# Patient Record
Sex: Male | Born: 1995 | Race: Black or African American | Hispanic: No | Marital: Single | Smoking: Never smoker
Health system: Southern US, Community
[De-identification: ages and names within clinical notes are randomized; demographics above are authoritative.]

## PROBLEM LIST (undated history)

## (undated) DIAGNOSIS — J45909 Unspecified asthma, uncomplicated: Secondary | ICD-10-CM

---
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
## 2018-12-20 ENCOUNTER — Encounter: Payer: Self-pay | Admitting: Emergency Medicine

## 2018-12-20 ENCOUNTER — Other Ambulatory Visit: Payer: Self-pay

## 2018-12-20 ENCOUNTER — Emergency Department
Admission: EM | Admit: 2018-12-20 | Discharge: 2018-12-20 | Disposition: A | Discharge status: Home / Self Care | Payer: Medicaid Other | Attending: Emergency Medicine | Admitting: Emergency Medicine

## 2018-12-20 ENCOUNTER — Emergency Department: Payer: Medicaid Other

## 2018-12-20 DIAGNOSIS — R0789 Other chest pain: Secondary | ICD-10-CM | POA: Diagnosis present

## 2018-12-20 DIAGNOSIS — J45909 Unspecified asthma, uncomplicated: Secondary | ICD-10-CM | POA: Insufficient documentation

## 2018-12-20 DIAGNOSIS — I300 Acute nonspecific idiopathic pericarditis: Secondary | ICD-10-CM | POA: Insufficient documentation

## 2018-12-20 LAB — CBC
HCT: 48.2 % (ref 39.0–52.0)
Hemoglobin: 16.1 g/dL (ref 13.0–17.0)
MCH: 29.8 pg (ref 26.0–34.0)
MCHC: 33.4 g/dL (ref 30.0–36.0)
MCV: 89.1 fL (ref 80.0–100.0)
Platelets: 303 10*3/uL (ref 150–400)
RBC: 5.41 MIL/uL (ref 4.22–5.81)
RDW: 12.2 % (ref 11.5–15.5)
WBC: 4.7 10*3/uL (ref 4.0–10.5)
nRBC: 0 % (ref 0.0–0.2)

## 2018-12-20 LAB — BASIC METABOLIC PANEL
Anion gap: 10 (ref 5–15)
BUN: 18 mg/dL (ref 6–20)
CO2: 27 mmol/L (ref 22–32)
Calcium: 9.7 mg/dL (ref 8.9–10.3)
Chloride: 103 mmol/L (ref 98–111)
Creatinine, Ser: 1.07 mg/dL (ref 0.61–1.24)
GFR calc Af Amer: >60 mL/min (ref >60)
GFR calc non Af Amer: >60 mL/min (ref >60)
Glucose, Bld: 97 mg/dL (ref 70–99)
Potassium: 4.1 mmol/L (ref 3.5–5.1)
Sodium: 140 mmol/L (ref 135–145)

## 2018-12-20 LAB — TROPONIN I (HIGH SENSITIVITY)
Troponin I (High Sensitivity): 6 ng/L (ref <18)
Troponin I (High Sensitivity): 5 ng/L (ref <18)

## 2018-12-20 MED ORDER — KETOROLAC TROMETHAMINE 60 MG/2ML IM SOLN: 30.0000 mg | Freq: Once | INTRAMUSCULAR | Status: DC

## 2018-12-20 MED ORDER — NAPROXEN 500 MG PO TABS: 500.0000 mg | ORAL_TABLET | Freq: Once | ORAL | Status: DC

## 2018-12-20 MED ORDER — NAPROXEN 500 MG PO TABS
500.0000 mg | ORAL_TABLET | Freq: Two times a day (BID) | ORAL | 0 refills | Status: AC
Start: 2018-12-20 — End: 2018-12-30

## 2018-12-20 MED ORDER — KETOROLAC TROMETHAMINE 30 MG/ML IJ SOLN
15.0000 mg | Freq: Once | INTRAMUSCULAR | Status: AC
  Administered 2018-12-20: 19:00:00 15 mg via INTRAVENOUS
  Filled 2018-12-20: qty 1

## 2018-12-20 NOTE — ED Triage Notes (Signed)
Pt here for central chest pain that started yesterday. Pain is constant. Denies any and all associated symptoms. No fever. No drug use. Pain worse when leaning forward. NSAIDS help mildly and very temporarily.

## 2018-12-20 NOTE — ED Notes (Signed)
Pt sitting in bed playing on phone in NAD, A&Ox4. Pt answers questions appropriately, no SOB reported, pain 4/10

## 2018-12-20 NOTE — ED Notes (Signed)
Per Dr. Charna Archer ok for patient to drink, pt given grape juice

## 2018-12-20 NOTE — ED Provider Notes (Signed)
[facility] Emergency Department Provider Note   ____________________________________________   First MD Initiated Contact with Patient 12/20/18 1540     (approximate)  I have reviewed the triage vital signs and the nursing notes.   HISTORY  Chief Complaint Chest Pain    HPI Erik Skinner is a 23 y.o. male with past medical history of asthma who presents to the ED complaining of chest pain.  Patient reports he has been having constant pain in the center of his chest since yesterday afternoon.  He describes the pain as sharp and feeling like pins-and-needles in the center of his chest.  It seems to be exacerbated with certain shifts in his position and when he puts his arms in a particular position.  He denies any trauma to his chest, has not had any fevers, cough, or shortness of breath.  He denies any pain or swelling in his lower extremities, has not had any recent surgeries or long trips.        Past Medical History:  Diagnosis Date  . Asthma     There are no active problems to display for this patient.   History reviewed. No pertinent surgical history.  Prior to Admission medications   Medication Sig Start Date End Date Taking? Authorizing Provider  albuterol (PROVENTIL HFA;VENTOLIN HFA) 108 (90 Base) MCG/ACT inhaler Inhale 2 puffs into the lungs every 6 (six) hours as needed for wheezing or shortness of breath. 06/13/16   Laban Emperor, PA-C  fluticasone (FLONASE) 50 MCG/ACT nasal spray Place 2 sprays into both nostrils daily. 06/13/16 06/13/17  Laban Emperor, PA-C  naproxen (NAPROSYN) 500 MG tablet Take 1 tablet (500 mg total) by mouth 2 (two) times daily with a meal for 10 days. 12/20/18 12/30/18  Blake Divine, MD    Allergies Patient has no known allergies.  History reviewed. No pertinent family history.  Social History Social History   Tobacco Use  . Smoking status: Never Smoker  . Smokeless tobacco: Never Used  Substance Use Topics   . Alcohol use: No  . Drug use: Not on file    Review of Systems  Constitutional: No fever/chills Eyes: No visual changes. ENT: No sore throat. Cardiovascular: Positive for chest pain. Respiratory: Denies shortness of breath. Gastrointestinal: No abdominal pain.  No nausea, no vomiting.  No diarrhea.  No constipation. Genitourinary: Negative for dysuria. Musculoskeletal: Negative for back pain. Skin: Negative for rash. Neurological: Negative for headaches, focal weakness or numbness.  ____________________________________________   PHYSICAL EXAM:  VITAL SIGNS: ED Triage Vitals  Enc Vitals Group     BP 12/20/18 1528 127/77     Pulse Rate 12/20/18 1528 65     Resp 12/20/18 1528 16     Temp 12/20/18 1528 98.8 F (37.1 C)     Temp Source 12/20/18 1528 Oral     SpO2 12/20/18 1528 99 %     Weight 12/20/18 1517 160 lb (72.6 kg)     Height 12/20/18 1517 5\' 10"  (1.778 m)     Head Circumference --      Peak Flow --      Pain Score 12/20/18 1517 4     Pain Loc --      Pain Edu? --      Excl. in [place]? --     Constitutional: Alert and oriented. Eyes: Conjunctivae are normal. Head: Atraumatic. Nose: No congestion/rhinnorhea. Mouth/Throat: Mucous membranes are moist. Neck: Normal ROM Cardiovascular: Normal rate, regular rhythm. Grossly normal heart sounds. Respiratory:  Normal respiratory effort.  No retractions. Lungs CTAB.  Mild tenderness to palpation of central chest. Gastrointestinal: Soft and nontender. No distention. Genitourinary: deferred Musculoskeletal: No lower extremity tenderness nor edema. Neurologic:  Normal speech and language. No gross focal neurologic deficits are appreciated. Skin:  Skin is warm, dry and intact. No rash noted. Psychiatric: Mood and affect are normal. Speech and behavior are normal.  ____________________________________________   LABS (all labs ordered are listed, but only abnormal results are displayed)  Labs Reviewed  BASIC METABOLIC  PANEL  CBC  TROPONIN I (HIGH SENSITIVITY)  TROPONIN I (HIGH SENSITIVITY)   ____________________________________________  EKG  ED ECG REPORT I, Chesley Noonharles Ayan Yankey, the attending physician, personally viewed and interpreted this ECG.   Date: 12/20/2018  EKG Time: 15:22  Rate: 72  Rhythm: normal sinus rhythm  Axis: Normal  Intervals:none  ST&T Change: Diffuse ST elevation with no reciprocal ST depression    PROCEDURES  Procedure(s) performed (including Critical Care):  Procedures   ____________________________________________   INITIAL IMPRESSION / ASSESSMENT AND PLAN / ED COURSE       23 year old male presents to the ED complaining of constant sharp and tingly pain in the center of his chest since yesterday which seems to be exacerbated by certain positions.  EKG shows diffuse ST elevation with no reciprocal ST depressions, most consistent with pericarditis.  Relatively low suspicion for ACS.  EKG discussed with Dr. Mariah MillingGollan by Dr. Cyril LoosenKinner shortly after patient's arrival, who agreed that pericarditis is most likely explanation unless troponin were to be elevated.  Labs reassuring, troponin within normal limits.  Would expect some elevation in troponin by now if this were to be ACS given patient has had constant symptoms since yesterday.  Chest x-ray negative for acute process.  We will plan on checking second set troponin, if this remains within normal limits patient would be appropriate for outpatient treatment with NSAIDs.  He did receive NSAIDs approximately 1 hour prior to arrival.  Case discussed with staff of Dr. Mariah MillingGollan, who will notify him of findings thus far and he will call back to the ED if needed.  Second set troponin within normal limits, will give dose of Toradol here in the ED and start patient on naproxen for apparent pericarditis.  Counseled patient on need to follow-up with PCP and return to the ED for new or worsening symptoms, patient agrees with plan.       ____________________________________________   FINAL CLINICAL IMPRESSION(S) / ED DIAGNOSES  Final diagnoses:  Acute idiopathic pericarditis  Atypical chest pain     ED Discharge Orders         Ordered    naproxen (NAPROSYN) 500 MG tablet  2 times daily with meals     12/20/18 1912           Note:  This document was prepared using Dragon voice recognition software and may include unintentional dictation errors.   Chesley NoonJessup, Lance Galas, MD 12/20/18 1921

## 2019-04-18 ENCOUNTER — Emergency Department: Payer: Medicaid Other

## 2019-04-18 ENCOUNTER — Other Ambulatory Visit: Payer: Self-pay

## 2019-04-18 ENCOUNTER — Encounter: Payer: Self-pay | Admitting: Emergency Medicine

## 2019-04-18 ENCOUNTER — Emergency Department
Admission: EM | Admit: 2019-04-18 | Discharge: 2019-04-18 | Disposition: A | Discharge status: Home / Self Care | Payer: Medicaid Other | Attending: Student | Admitting: Student

## 2019-04-18 DIAGNOSIS — J45909 Unspecified asthma, uncomplicated: Secondary | ICD-10-CM | POA: Insufficient documentation

## 2019-04-18 DIAGNOSIS — M25512 Pain in left shoulder: Secondary | ICD-10-CM

## 2019-04-18 DIAGNOSIS — M778 Other enthesopathies, not elsewhere classified: Secondary | ICD-10-CM | POA: Insufficient documentation

## 2019-04-18 MED ORDER — NAPROXEN 500 MG PO TABS: 500.0000 mg | ORAL_TABLET | Freq: Two times a day (BID) | ORAL | 1 refills | Status: AC

## 2019-04-18 MED ORDER — CYCLOBENZAPRINE HCL 5 MG PO TABS: 5.0000 mg | ORAL_TABLET | Freq: Every day | ORAL | 0 refills | Status: AC

## 2019-04-18 NOTE — ED Triage Notes (Signed)
Pt reports pain to left shoulder since early Tuesday. Pt reports no obvious injuries but cannot lift his left arm. Pt states started shortly ater work but states not WC.

## 2019-04-18 NOTE — Discharge Instructions (Signed)
Your exam and XR are negative for any acute fracture or rotator cuff tears. Take the prescription meds as directed. Continue to use the Salonpas, ice, and heat packs. Follow-up with Ortho for ongoing symptoms. Return as needed.

## 2019-04-18 NOTE — ED Provider Notes (Signed)
[facility] Emergency Department Provider Note ____________________________________________  Time seen: 1530  I have reviewed the triage vital signs and the nursing notes.  HISTORY  Chief Complaint  Shoulder Pain   HPI Erik Skinner is a 24 y.o. male presents to the ED for evaluation of left shoulder pain since Tuesday. He denies any injury, trauma, or weakness. He describes pain with ROM to the shoulder. He denies a history of chronic or ongoing shoulder pain.   Past Medical History:  Diagnosis Date  . Asthma     There are no problems to display for this patient.   History reviewed. No pertinent surgical history.  Prior to Admission medications   Medication Sig Start Date End Date Taking? Authorizing Provider  albuterol (PROVENTIL HFA;VENTOLIN HFA) 108 (90 Base) MCG/ACT inhaler Inhale 2 puffs into the lungs every 6 (six) hours as needed for wheezing or shortness of breath. 06/13/16   Enid Derry, PA-C  cyclobenzaprine (FLEXERIL) 5 MG tablet Take 1 tablet (5 mg total) by mouth at bedtime. 04/18/19   Esli Jernigan, Charlesetta Ivory, PA-C  fluticasone (FLONASE) 50 MCG/ACT nasal spray Place 2 sprays into both nostrils daily. 06/13/16 06/13/17  Enid Derry, PA-C  naproxen (NAPROSYN) 500 MG tablet Take 1 tablet (500 mg total) by mouth 2 (two) times daily with a meal. 04/18/19 05/18/19  Samarth Ogle, Charlesetta Ivory, PA-C    Allergies Patient has no known allergies.  History reviewed. No pertinent family history.  Social History Social History   Tobacco Use  . Smoking status: Never Smoker  . Smokeless tobacco: Never Used  Substance Use Topics  . Alcohol use: No  . Drug use: Not on file    Review of Systems  Constitutional: Negative for fever. Cardiovascular: Negative for chest pain. Respiratory: Negative for shortness of breath. Musculoskeletal: Negative for back pain. Left shoulder pain as above Skin: Negative for rash. Neurological: Negative for headaches,  focal weakness or numbness. ____________________________________________  PHYSICAL EXAM:  VITAL SIGNS: ED Triage Vitals  Enc Vitals Group     BP 04/18/19 1449 134/78     Pulse Rate 04/18/19 1449 61     Resp 04/18/19 1449 17     Temp 04/18/19 1449 98.2 F (36.8 C)     Temp Source 04/18/19 1449 Oral     SpO2 04/18/19 1449 97 %     Weight 04/18/19 1447 160 lb (72.6 kg)     Height 04/18/19 1447 5\' 11"  (1.803 m)     Head Circumference --      Peak Flow --      Pain Score 04/18/19 1447 5     Pain Loc --      Pain Edu? --      Excl. in [place]? --     Constitutional: Alert and oriented. Well appearing and in no distress. Head: Normocephalic and atraumatic. Eyes: Conjunctivae are normal.  Normal extraocular movements Neck: Supple. Normal ROM Cardiovascular: Normal rate, regular rhythm. Normal distal pulses. Respiratory: Normal respiratory effort. No wheezes/rales/rhonchi. Musculoskeletal: left shoulder without deformity, dislocation, or sulcus sign. Decreased extension and abduction to 90 degrees, due to pain. Normal rotator cuff testing. Normal grip strength bilaterally. Nontender with normal range of motion in all extremities.  Neurologic:  Normal gait without ataxia. Normal speech and language. No gross focal neurologic deficits are appreciated. Skin:  Skin is warm, dry and intact. No rash noted. ____________________________________________   RADIOLOGY  DG Left Shoulder Negative  ____________________________________________  PROCEDURES  Procedures ____________________________________________  INITIAL IMPRESSION /  ASSESSMENT AND PLAN / ED COURSE  Patient with ED evaluation of left shoulder pain.  His exam is overall benign reassuring at this time.  No signs of internal derangement.  X-rays negative for any acute fracture or dislocation present left represent a rotator cuff tendinitis at this time.  The preceding cause may be work-related activities.  He will be discharged with a  prescription for naproxen as well as Flexeril take as directed.  He will follow-up with Ortho for ongoing symptoms.  Return precautions have been reviewed.  Erik Skinner was evaluated in Emergency Department on 04/18/2019 for the symptoms described in the history of present illness. He was evaluated in the context of the global COVID-19 pandemic, which necessitated consideration that the patient might be at risk for infection with the SARS-CoV-2 virus that causes COVID-19. Institutional protocols and algorithms that pertain to the evaluation of patients at risk for COVID-19 are in a state of rapid change based on information released by regulatory bodies including the CDC and federal and state organizations. These policies and algorithms were followed during the patient's care in the ED. ____________________________________________  FINAL CLINICAL IMPRESSION(S) / ED DIAGNOSES  Final diagnoses:  Acute pain of left shoulder  Shoulder tendinitis, left      Rosland Riding, Dannielle Karvonen, PA-C 04/18/19 2148    Lilia Pro., MD 04/18/19 2342

## 2020-10-16 ENCOUNTER — Emergency Department
Admission: EM | Admit: 2020-10-16 | Discharge: 2020-10-16 | Disposition: A | Discharge status: Home / Self Care | Payer: Medicaid Other | Attending: Emergency Medicine | Admitting: Emergency Medicine

## 2020-10-16 ENCOUNTER — Encounter: Payer: Self-pay | Admitting: Emergency Medicine

## 2020-10-16 ENCOUNTER — Other Ambulatory Visit: Payer: Self-pay

## 2020-10-16 DIAGNOSIS — J029 Acute pharyngitis, unspecified: Secondary | ICD-10-CM

## 2020-10-16 DIAGNOSIS — Z20822 Contact with and (suspected) exposure to covid-19: Secondary | ICD-10-CM | POA: Diagnosis not present

## 2020-10-16 DIAGNOSIS — J45909 Unspecified asthma, uncomplicated: Secondary | ICD-10-CM | POA: Insufficient documentation

## 2020-10-16 DIAGNOSIS — R1310 Dysphagia, unspecified: Secondary | ICD-10-CM | POA: Diagnosis present

## 2020-10-16 LAB — RESP PANEL BY RT-PCR (FLU A&B, COVID) ARPGX2
Influenza A by PCR: NEGATIVE
Influenza B by PCR: NEGATIVE
SARS Coronavirus 2 by RT PCR: NEGATIVE

## 2020-10-16 LAB — GROUP A STREP BY PCR: Group A Strep by PCR: NOT DETECTED

## 2020-10-16 MED ORDER — DEXAMETHASONE 4 MG PO TABS
10.0000 mg | ORAL_TABLET | Freq: Once | ORAL | Status: AC
  Administered 2020-10-16: 10 mg via ORAL
  Filled 2020-10-16: qty 1

## 2020-10-16 MED ORDER — IBUPROFEN 800 MG PO TABS
800.0000 mg | ORAL_TABLET | Freq: Once | ORAL | Status: AC
  Administered 2020-10-16: 800 mg via ORAL
  Filled 2020-10-16: qty 1

## 2020-10-16 NOTE — ED Notes (Signed)
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 Attempted to print consent for discharge, unable to print. Pt gives verbal consent for discharge.

## 2020-10-16 NOTE — ED Provider Notes (Signed)
[facility] Emergency Department Provider Note  ____________________________________________   Event Date/Time   First MD Initiated Contact with Patient 10/16/20 [phone]     (approximate)  I have reviewed the triage vital signs and the nursing notes.   HISTORY  Chief Complaint Oral Swelling    HPI Erik Skinner is a 25 y.o. male with history of asthma who presents to the emergency department with 3 days of feeling like his throat is swollen and having a difficult time swallowing.  He denies any fevers, chills, cough.  Has had nausea and diarrhea.  Reports he had similar symptoms before and was told he had tonsillitis.  No known sick contacts.  No new exposures.  No lip or tongue swelling.  No rash.  Had a COVID test at home that was negative today.        Past Medical History:  Diagnosis Date   Asthma     There are no problems to display for this patient.   History reviewed. No pertinent surgical history.  Prior to Admission medications   Medication Sig Start Date End Date Taking? Authorizing Provider  albuterol (PROVENTIL HFA;VENTOLIN HFA) 108 (90 Base) MCG/ACT inhaler Inhale 2 puffs into the lungs every 6 (six) hours as needed for wheezing or shortness of breath. 06/13/16   Enid Derry, PA-C  cyclobenzaprine (FLEXERIL) 5 MG tablet Take 1 tablet (5 mg total) by mouth at bedtime. 04/18/19   Menshew, Charlesetta Ivory, PA-C  fluticasone (FLONASE) 50 MCG/ACT nasal spray Place 2 sprays into both nostrils daily. 06/13/16 06/13/17  Enid Derry, PA-C    Allergies Patient has no known allergies.  No family history on file.  Social History Social History   Tobacco Use   Smoking status: Never   Smokeless tobacco: Never  Vaping Use   Vaping Use: Never used  Substance Use Topics   Alcohol use: No    Review of Systems Constitutional: No fever. Eyes: No visual changes. ENT: No sore throat. Cardiovascular: Denies chest pain. Respiratory: Denies  shortness of breath. Gastrointestinal: No nausea, vomiting, diarrhea. Genitourinary: Negative for dysuria. Musculoskeletal: Negative for back pain. Skin: Negative for rash. Neurological: Negative for focal weakness or numbness.  ____________________________________________   PHYSICAL EXAM:  VITAL SIGNS: ED Triage Vitals  Enc Vitals Group     BP 10/16/20 0509 (!) 129/93     Pulse Rate 10/16/20 0509 85     Resp 10/16/20 0509 18     Temp 10/16/20 0509 98.1 F (36.7 C)     Temp Source 10/16/20 0509 Oral     SpO2 10/16/20 0509 98 %     Weight 10/16/20 0504 170 lb (77.1 kg)     Height 10/16/20 0504 5\' 11"  (1.803 m)     Head Circumference --      Peak Flow --      Pain Score 10/16/20 0504 0     Pain Loc --      Pain Edu? --      Excl. in GC? --    CONSTITUTIONAL: Alert and oriented and responds appropriately to questions. Well-appearing; well-nourished HEAD: Normocephalic EYES: Conjunctivae clear, pupils appear equal, EOM appear intact ENT: normal nose; moist mucous membranes, no angioedema, normal speech, swallowing frequently, no dental caries or dental abscess, no sign of Ludwig's angina, normal speech, no trismus or drooling, patient does have pharyngeal erythema with mild tonsillar hypertrophy without exudate, no uvular deviation or uvular swelling noted, no stridor, posterior oropharynx patent without soft tissue swelling,  tongue lies flat in the bottom of the mouth NECK: Supple, normal ROM, trachea midline, no thyromegaly, no cervical lymphadenopathy, no meningismus CARD: RRR; S1 and S2 appreciated; no murmurs, no clicks, no rubs, no gallops RESP: Normal chest excursion without splinting or tachypnea; breath sounds clear and equal bilaterally; no wheezes, no rhonchi, no rales, no hypoxia or respiratory distress, speaking full sentences ABD/GI: Normal bowel sounds; non-distended; soft, non-tender, no rebound, no guarding, no peritoneal signs, no hepatosplenomegaly BACK: The back  appears normal EXT: Normal ROM in all joints; no deformity noted, no edema; no cyanosis SKIN: Normal color for age and race; warm; no rash on exposed skin, no urticaria NEURO: Moves all extremities equally PSYCH: The patient's mood and manner are appropriate.  ____________________________________________   LABS (all labs ordered are listed, but only abnormal results are displayed)  Labs Reviewed  GROUP A STREP BY PCR  RESP PANEL BY RT-PCR (FLU A&B, COVID) [id number]   ____________________________________________  EKG   ____________________________________________  RADIOLOGY I, Mavrick Mcquigg, personally viewed and evaluated these images (plain radiographs) as part of my medical decision making, as well as reviewing the written report by the radiologist.  ED MD interpretation:    Official radiology report(s): No results found.  ____________________________________________   PROCEDURES  Procedure(s) performed (including Critical Care):  Procedures    ____________________________________________   INITIAL IMPRESSION / ASSESSMENT AND PLAN / ED COURSE  As part of my medical decision making, I reviewed the following data within the electronic MEDICAL RECORD NUMBER Nursing notes reviewed and incorporated, Labs reviewed , Old chart reviewed, and Notes from prior ED visits         Patient here with complaints of feeling like his throat is tight.  No chest pain, shortness of breath.  No fever.  Does have signs of pharyngitis on exam.  No signs of significant tonsillitis, uvulitis, PTA, deep space neck infection, meningitis.  Will treat with ibuprofen, Decadron.  Strep and COVID swabs pending.  No sign of allergic reaction today.  Posterior oropharynx is patent with normal phonation.  No stridor.  No hypoxia.  ED PROGRESS  Patient reports some improvement in symptoms.  Strep, COVID and flu negative.  Able to swallow without difficulty.  Recommended alternating Tylenol, Motrin  over-the-counter.  Recommended throat lozenges, Chloraseptic spray.  No signs of esophageal obstruction, rupture.  I feel he is safe for discharge home.  At this time, I do not feel there is any life-threatening condition present. I have reviewed, interpreted and discussed all results (EKG, imaging, lab, urine as appropriate) and exam findings with patient/family. I have reviewed nursing notes and appropriate previous records.  I feel the patient is safe to be discharged home without further emergent workup and can continue workup as an outpatient as needed. Discussed usual and customary return precautions. Patient/family verbalize understanding and are comfortable with this plan.  Outpatient follow-up has been provided as needed. All questions have been answered.  ____________________________________________   FINAL CLINICAL IMPRESSION(S) / ED DIAGNOSES  Final diagnoses:  Pharyngitis, unspecified etiology     ED Discharge Orders     None       *Please note:  Lenord Fralix was evaluated in Emergency Department on 10/16/2020 for the symptoms described in the history of present illness. He was evaluated in the context of the global COVID-19 pandemic, which necessitated consideration that the patient might be at risk for infection with the SARS-CoV-2 virus that causes COVID-19. Institutional protocols and algorithms that pertain to the evaluation  of patients at risk for COVID-19 are in a state of rapid change based on information released by regulatory bodies including the CDC and federal and state organizations. These policies and algorithms were followed during the patient's care in the ED.  Some ED evaluations and interventions may be delayed as a result of limited staffing during and the pandemic.*   Note:  This document was prepared using Dragon voice recognition software and may include unintentional dictation errors.    Aarionna Germer, Layla Maw, DO 10/16/20 [phone]

## 2020-10-16 NOTE — ED Notes (Signed)
Pt walked to hall bathroom with steady gait. Back in bed. Alert, resting.

## 2020-10-16 NOTE — Discharge Instructions (Addendum)
You may alternate Tylenol 1000 mg every 6 hours as needed for pain, fever and Ibuprofen 800 mg every 8 hours as needed for pain, fever.  Please take Ibuprofen with food.  Do not take more than 4000 mg of Tylenol (acetaminophen) in a 24 hour period.  You may use over-the-counter Chloraseptic spray, throat lozenges as needed to help with throat pain.  Your strep test, COVID test, flu test today were negative.    Steps to find a Primary Care Provider (PCP):  Call [phone] or [phone] to access "[place] Find a Doctor Service."  2.  You may also go on the [facility] website at [email or web address]

## 2020-10-16 NOTE — ED Notes (Signed)
Able to print discharge consent. Pt signed and RN signed as witness.

## 2020-10-16 NOTE — ED Triage Notes (Addendum)
Patient ambulatory to triage with steady gait, without difficulty, appears anxious with frequent swallowing noted; pt reports for the last 3 days has felt as if his throat is swollen; took COVID test yest that was negative and denies any recent illness, no swelling or noted to back of throat

## 2021-06-15 IMAGING — CR DG SHOULDER 2+V*L*
1 series · 3 of 3 positions shown · non-contrast
Comparison: None.

CLINICAL DATA: Left shoulder pain with movement since early
yesterday. No known injury.

EXAM:
LEFT SHOULDER - 2+ VIEW

[Series 1: dg shoulder left · 0.14mm/px · 3 of 3 slices shown]
[im 1/3]
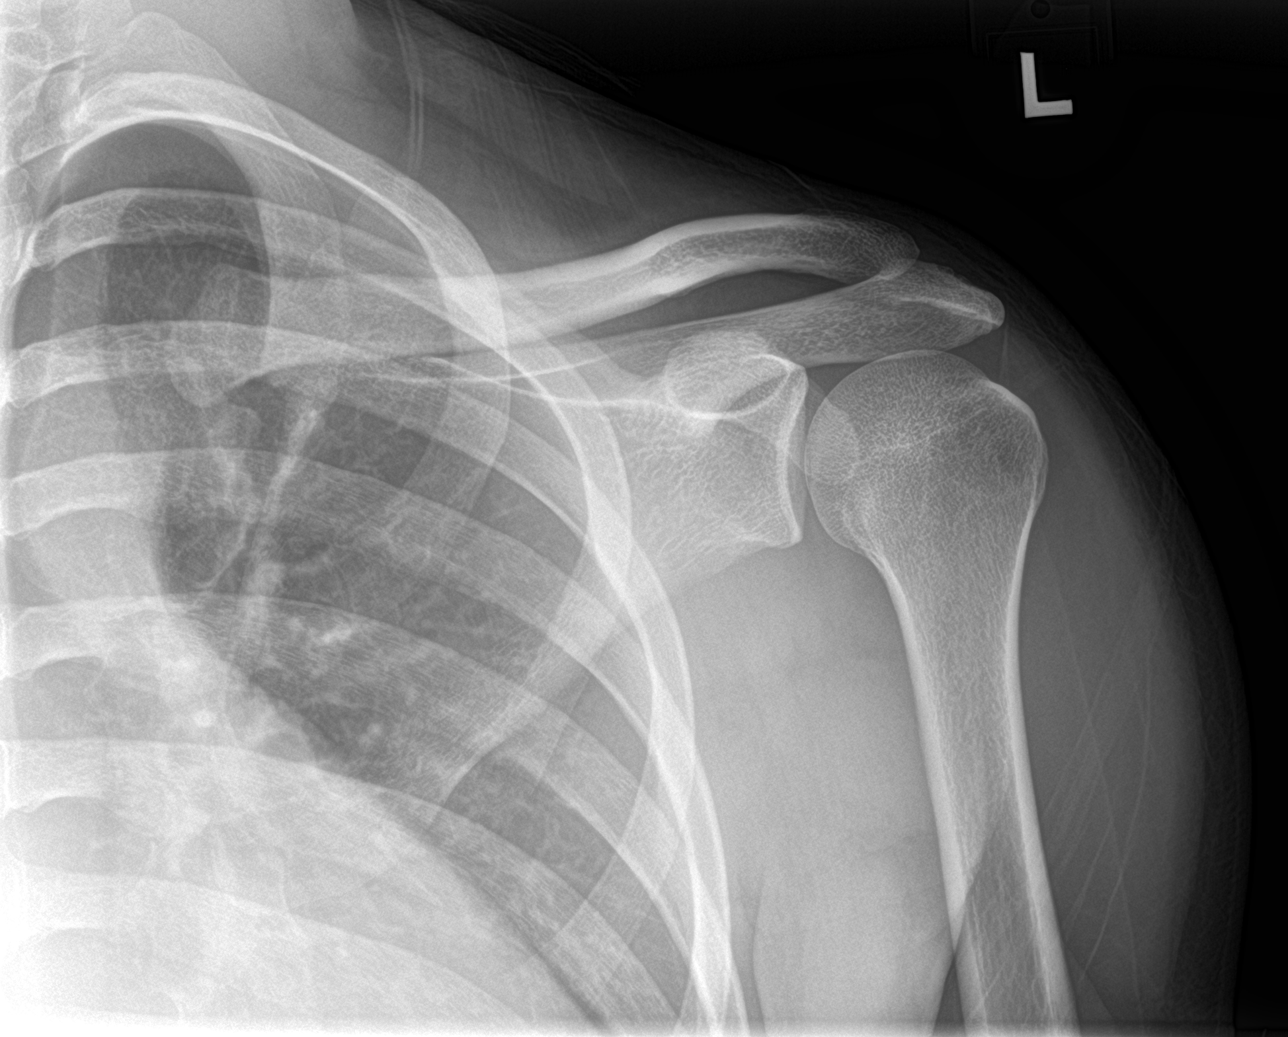
[im 2/3]
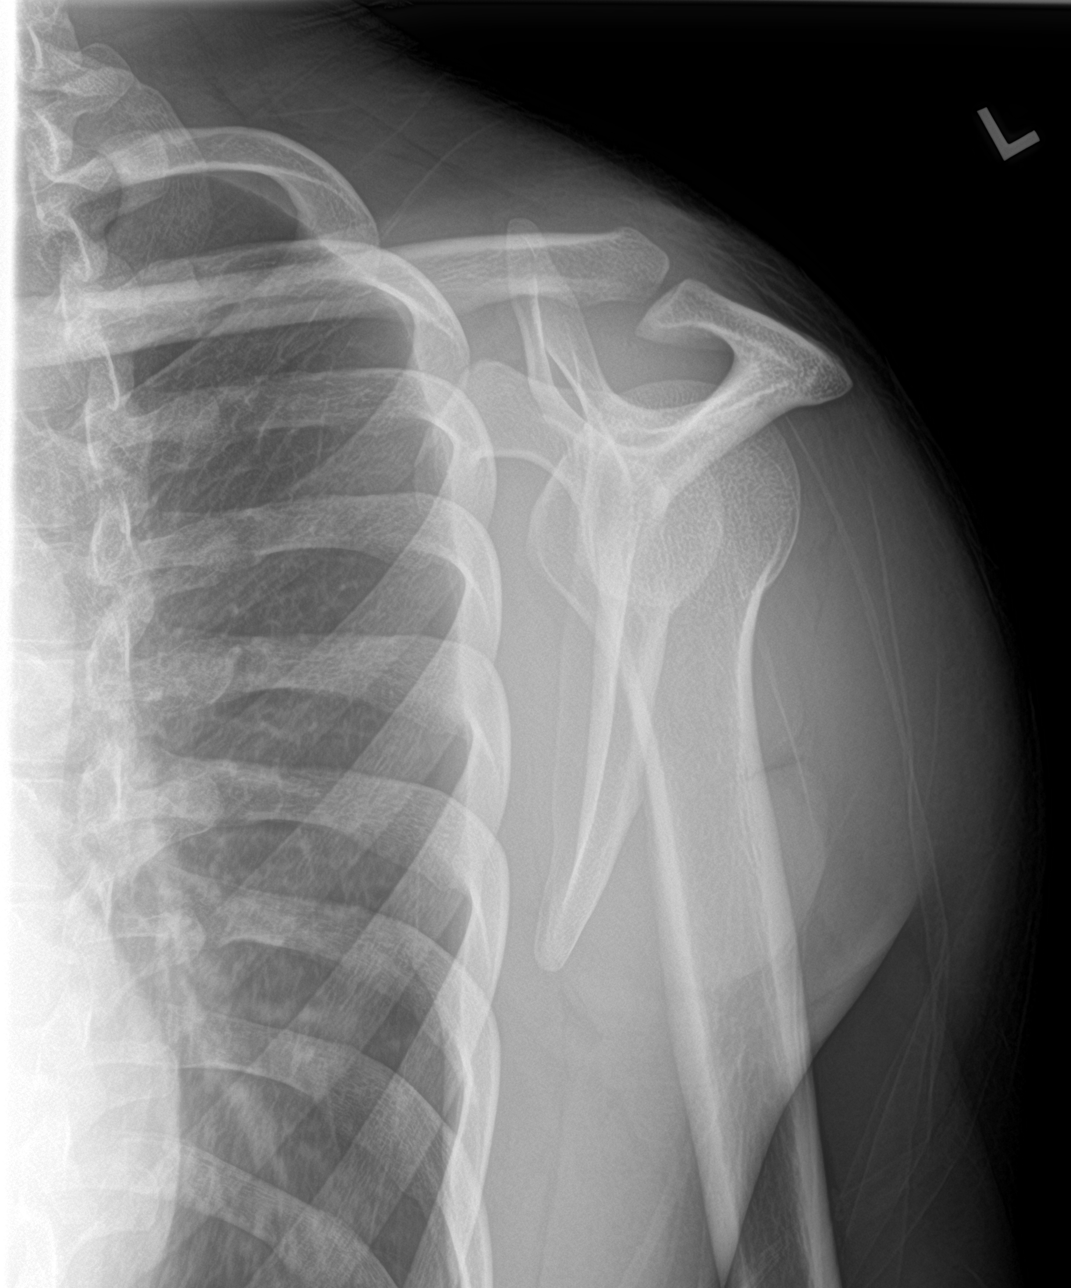
[im 3/3]
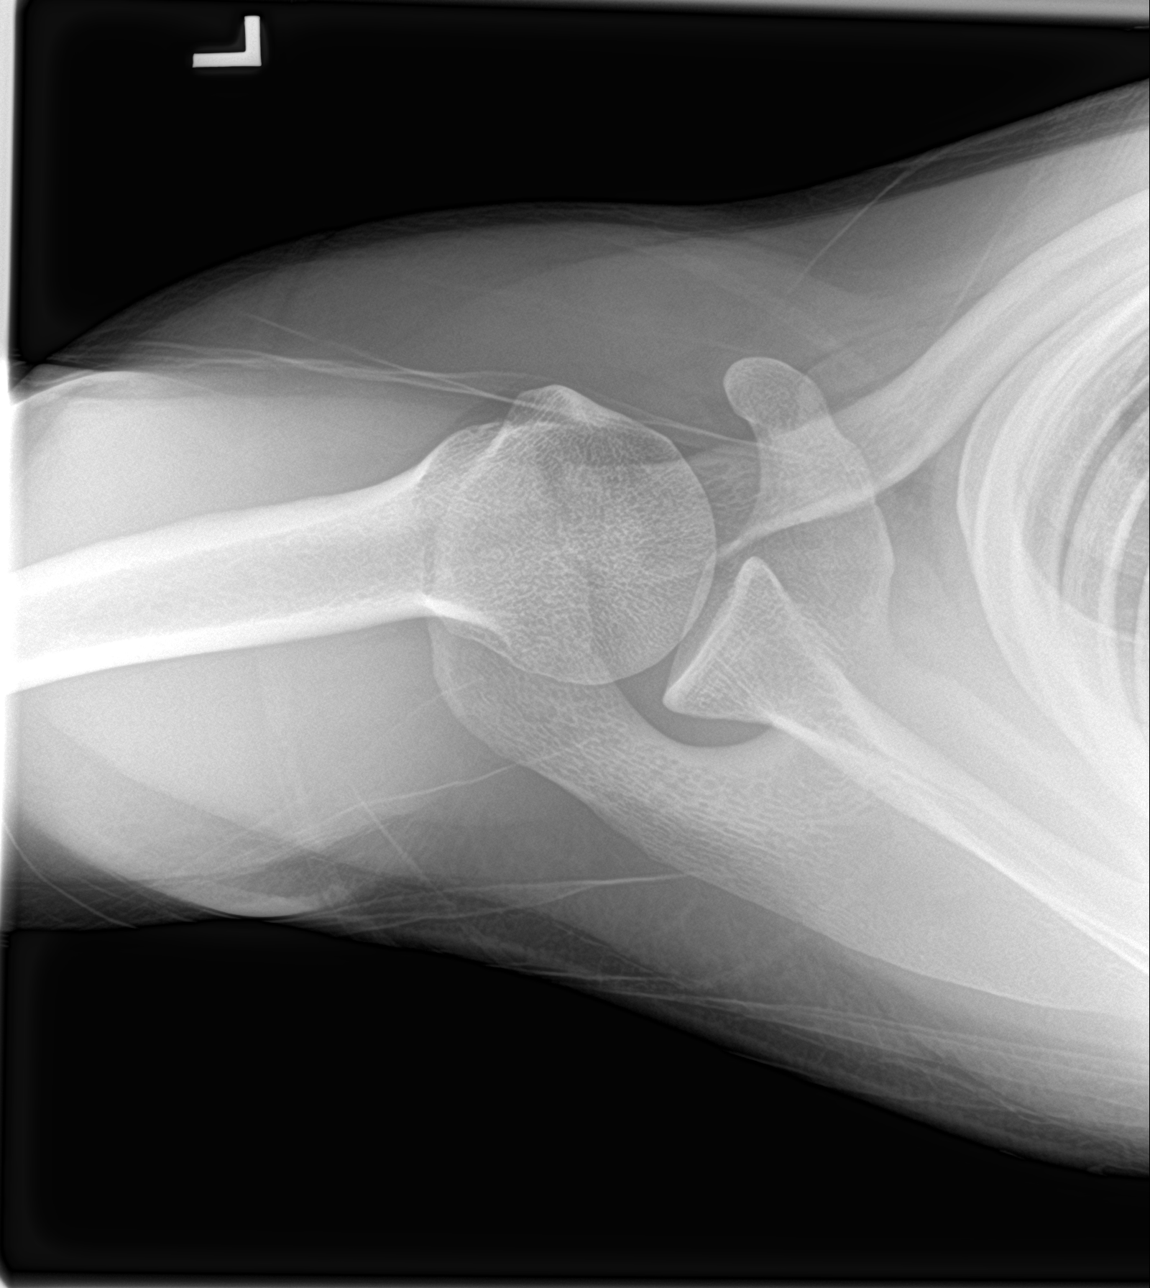

[3 of 3 positions shown; findings below may reference images not displayed]

FINDINGS: There is no evidence of fracture or dislocation. There is no
evidence of arthropathy or other focal bone abnormality. Soft
tissues are unremarkable.
IMPRESSION: Normal examination.
